# Patient Record
Sex: Female | Born: 1960
Health system: Southern US, Community
[De-identification: ages and names within clinical notes are randomized; demographics above are authoritative.]

## PROBLEM LIST (undated history)

## (undated) DIAGNOSIS — K649 Unspecified hemorrhoids: Secondary | ICD-10-CM

## (undated) HISTORY — DX: Unspecified hemorrhoids: K64.9

## (undated) HISTORY — PX: WISDOM TOOTH EXTRACTION: SHX21

---

## 2005-02-14 ENCOUNTER — Encounter: Admission: RE | Admit: 2005-02-14 | Discharge: 2005-02-14 | Payer: Self-pay

## 2005-07-30 ENCOUNTER — Encounter: Admission: RE | Admit: 2005-07-30 | Discharge: 2005-07-30 | Payer: Self-pay | Admitting: *Deleted

## 2006-01-21 ENCOUNTER — Encounter: Admission: RE | Admit: 2006-01-21 | Discharge: 2006-01-21 | Payer: Self-pay | Admitting: *Deleted

## 2007-01-25 ENCOUNTER — Encounter: Admission: RE | Admit: 2007-01-25 | Discharge: 2007-01-25 | Payer: Self-pay | Admitting: Obstetrics and Gynecology

## 2008-01-27 ENCOUNTER — Encounter: Admission: RE | Admit: 2008-01-27 | Discharge: 2008-01-27 | Payer: Self-pay | Admitting: Obstetrics and Gynecology

## 2009-02-21 ENCOUNTER — Encounter: Admission: RE | Admit: 2009-02-21 | Discharge: 2009-02-21 | Payer: Self-pay | Admitting: Obstetrics and Gynecology

## 2010-03-05 ENCOUNTER — Other Ambulatory Visit: Payer: Self-pay | Admitting: Obstetrics and Gynecology

## 2010-03-05 DIAGNOSIS — Z1231 Encounter for screening mammogram for malignant neoplasm of breast: Secondary | ICD-10-CM

## 2010-03-19 ENCOUNTER — Ambulatory Visit
Admission: RE | Admit: 2010-03-19 | Discharge: 2010-03-19 | Disposition: A | Discharge status: Home / Self Care | Payer: BC Managed Care – PPO | Source: Ambulatory Visit | Attending: Obstetrics and Gynecology | Admitting: Obstetrics and Gynecology

## 2010-03-19 DIAGNOSIS — Z1231 Encounter for screening mammogram for malignant neoplasm of breast: Secondary | ICD-10-CM

## 2010-03-21 ENCOUNTER — Other Ambulatory Visit: Payer: Self-pay | Admitting: Obstetrics and Gynecology

## 2010-03-21 DIAGNOSIS — R928 Other abnormal and inconclusive findings on diagnostic imaging of breast: Secondary | ICD-10-CM

## 2010-03-27 ENCOUNTER — Ambulatory Visit
Admission: RE | Admit: 2010-03-27 | Discharge: 2010-03-27 | Disposition: A | Discharge status: Home / Self Care | Payer: BC Managed Care – PPO | Source: Ambulatory Visit | Attending: Obstetrics and Gynecology | Admitting: Obstetrics and Gynecology

## 2010-03-27 DIAGNOSIS — R928 Other abnormal and inconclusive findings on diagnostic imaging of breast: Secondary | ICD-10-CM

## 2011-12-02 ENCOUNTER — Other Ambulatory Visit: Payer: Self-pay | Admitting: Obstetrics and Gynecology

## 2011-12-02 DIAGNOSIS — Z1231 Encounter for screening mammogram for malignant neoplasm of breast: Secondary | ICD-10-CM

## 2012-01-09 ENCOUNTER — Ambulatory Visit
Admission: RE | Admit: 2012-01-09 | Discharge: 2012-01-09 | Disposition: A | Discharge status: Home / Self Care | Payer: BC Managed Care – PPO | Source: Ambulatory Visit | Attending: Obstetrics and Gynecology | Admitting: Obstetrics and Gynecology

## 2012-01-09 DIAGNOSIS — Z1231 Encounter for screening mammogram for malignant neoplasm of breast: Secondary | ICD-10-CM

## 2014-03-29 ENCOUNTER — Other Ambulatory Visit: Payer: Self-pay | Admitting: Obstetrics and Gynecology

## 2014-03-30 ENCOUNTER — Other Ambulatory Visit: Payer: Self-pay | Admitting: Obstetrics and Gynecology

## 2014-03-30 DIAGNOSIS — M810 Age-related osteoporosis without current pathological fracture: Secondary | ICD-10-CM

## 2014-04-25 ENCOUNTER — Ambulatory Visit
Admission: RE | Admit: 2014-04-25 | Discharge: 2014-04-25 | Disposition: A | Discharge status: Home / Self Care | Payer: BLUE CROSS/BLUE SHIELD | Source: Ambulatory Visit | Attending: Obstetrics and Gynecology | Admitting: Obstetrics and Gynecology

## 2014-04-25 DIAGNOSIS — M810 Age-related osteoporosis without current pathological fracture: Secondary | ICD-10-CM

## 2015-04-17 ENCOUNTER — Other Ambulatory Visit: Payer: Self-pay | Admitting: Internal Medicine

## 2015-04-17 ENCOUNTER — Ambulatory Visit
Admission: RE | Admit: 2015-04-17 | Discharge: 2015-04-17 | Disposition: A | Discharge status: Home / Self Care | Payer: BLUE CROSS/BLUE SHIELD | Source: Ambulatory Visit | Attending: Internal Medicine | Admitting: Internal Medicine

## 2015-04-17 DIAGNOSIS — R0781 Pleurodynia: Secondary | ICD-10-CM

## 2015-06-21 ENCOUNTER — Other Ambulatory Visit: Payer: Self-pay | Admitting: Internal Medicine

## 2015-06-21 DIAGNOSIS — R1012 Left upper quadrant pain: Secondary | ICD-10-CM

## 2015-07-04 ENCOUNTER — Ambulatory Visit
Admission: RE | Admit: 2015-07-04 | Discharge: 2015-07-04 | Disposition: A | Discharge status: Home / Self Care | Payer: BLUE CROSS/BLUE SHIELD | Source: Ambulatory Visit | Attending: Internal Medicine | Admitting: Internal Medicine

## 2015-07-04 DIAGNOSIS — R1012 Left upper quadrant pain: Secondary | ICD-10-CM

## 2015-07-04 IMAGING — CT CT ABDOMEN W/ CM
3 of 5 series · 12 of 36 positions shown, 18 images · IV contrast (READICAT/WATER & [ID] ISOVUE 300)
Comparison: None.

CLINICAL DATA: Left-sided abdominal pain.  Left upper quadrant.

EXAM:
CT ABDOMEN WITH CONTRAST
TECHNIQUE: Multidetector CT imaging of the abdomen was performed using the
standard protocol following bolus administration of intravenous
contrast.
CONTRAST:  100mL [ON] IOPAMIDOL ([ON]) INJECTION 61%

[Series 3: abdomen with · axial · 0.58mm/px · z∈[-254,-64]mm · 6 of 54 slices shown, 11 images]
[im 8/54  soft-tissue]
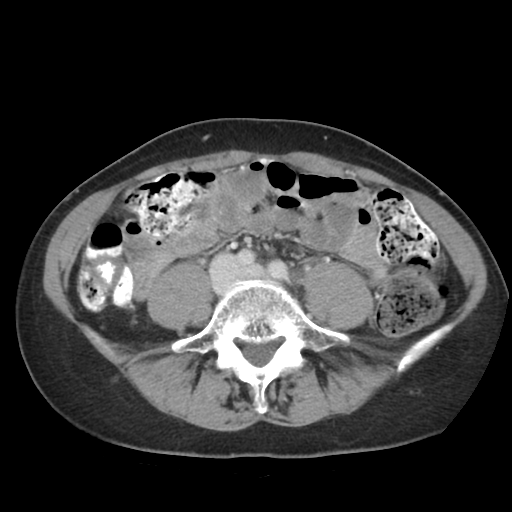
[im 8/54  bone]
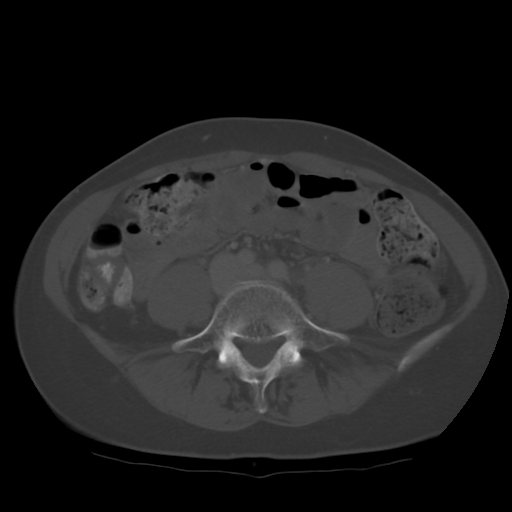
[im 16/54  soft-tissue]
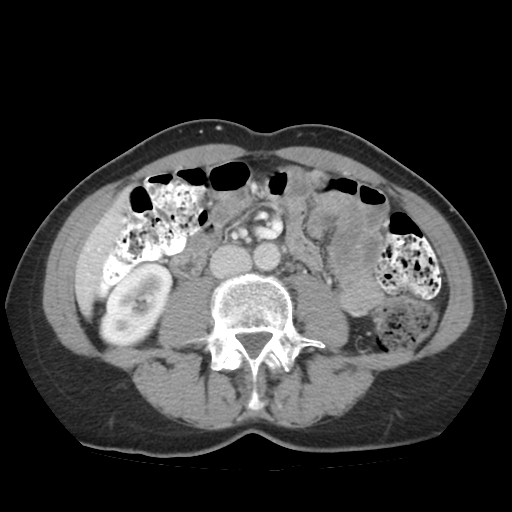
[im 23/54  soft-tissue]
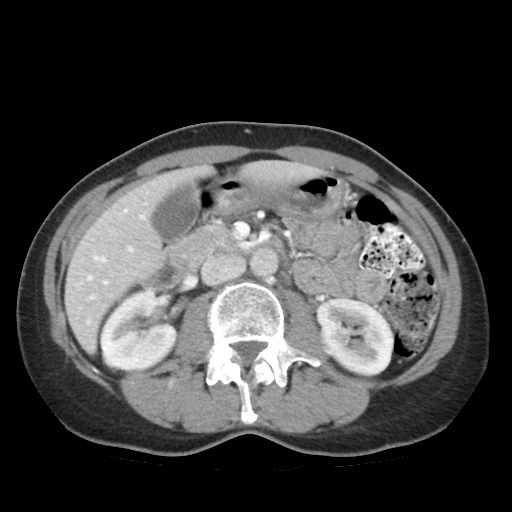
[im 23/54  lung]
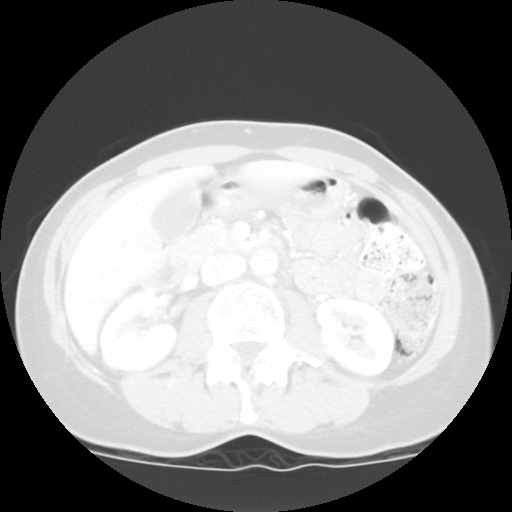
[im 31/54  soft-tissue]
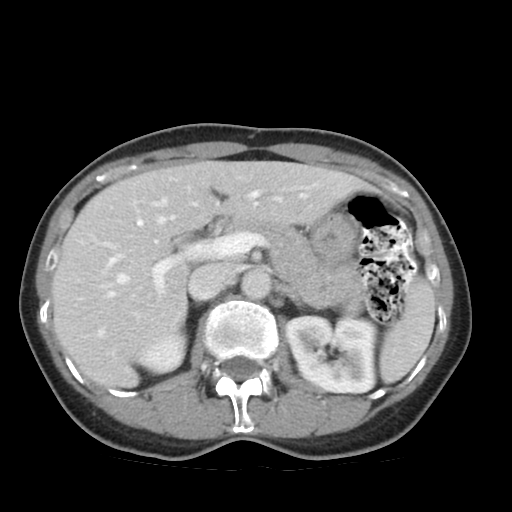
[im 31/54  lung]
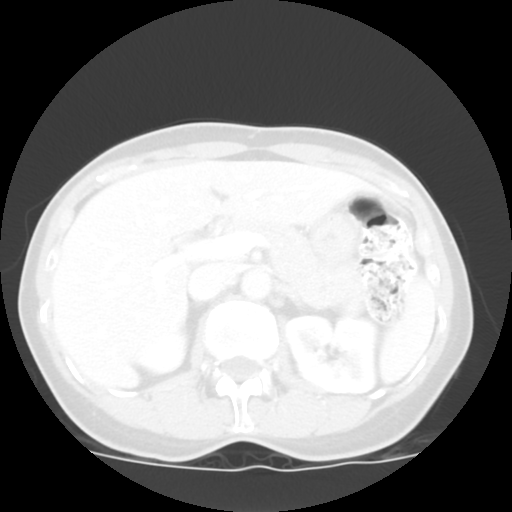
[im 38/54  soft-tissue]
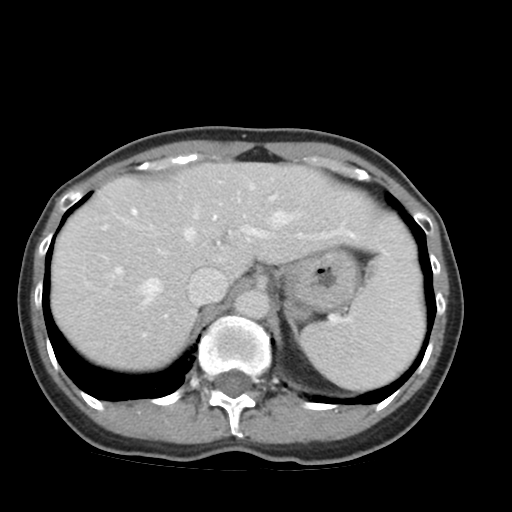
[im 38/54  lung]
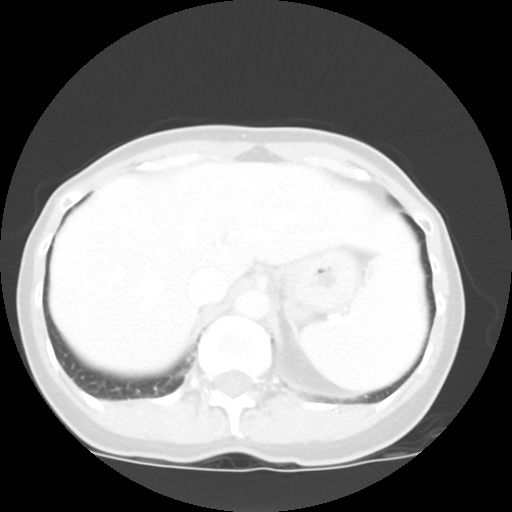
[im 46/54  soft-tissue]
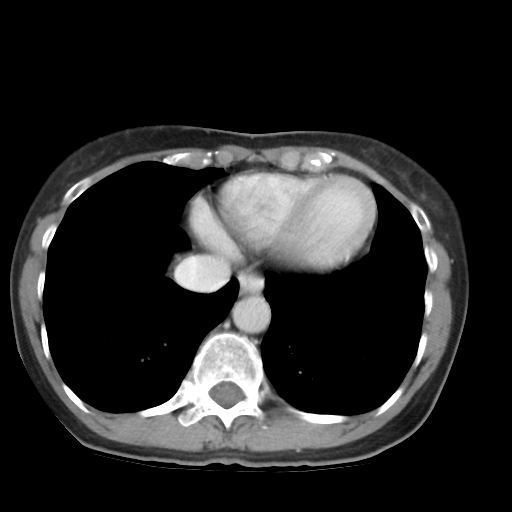
[im 46/54  lung]
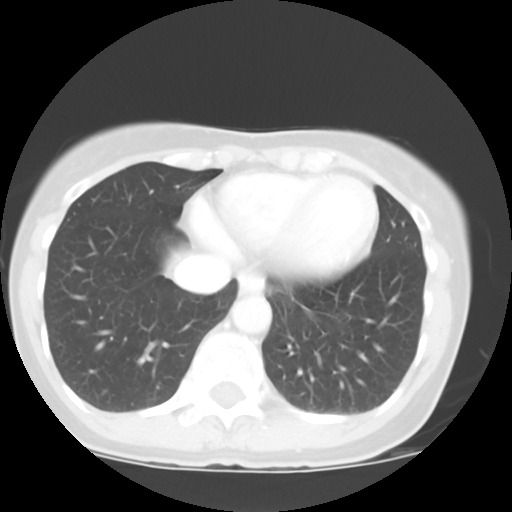

[Series 601: coronal body · coronal · 0.58mm/px · 1 of 87 slices shown, 2 images]
[im 29/87  soft-tissue]
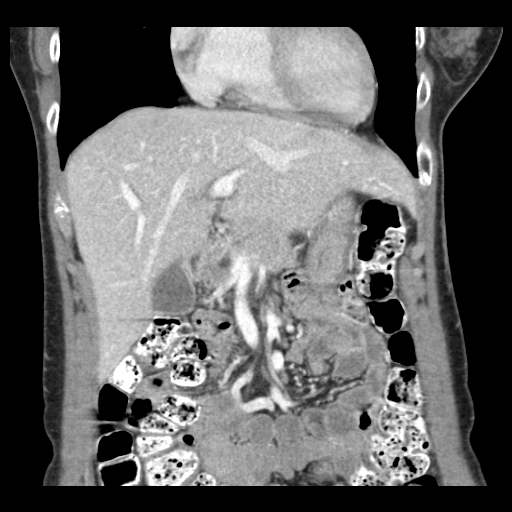
[im 29/87  bone]
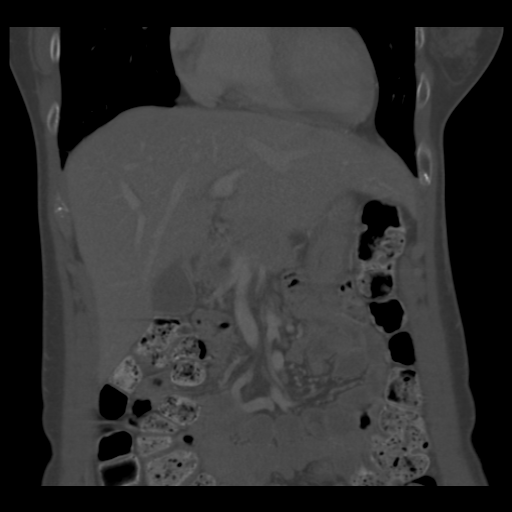

[Series 602: sagittal body · sagittal · 0.58mm/px · 5 of 120 slices shown]
[im 8/120  soft-tissue]
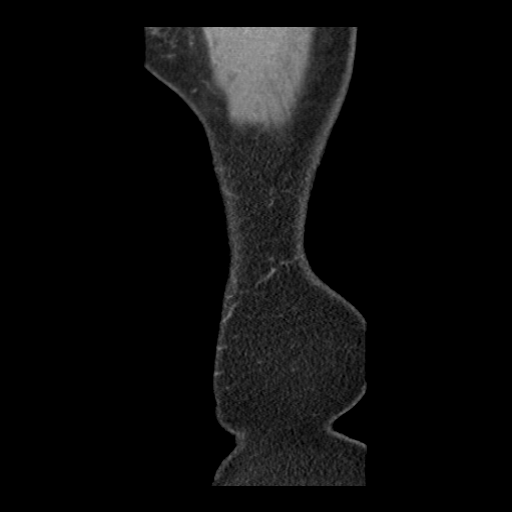
[im 23/120  soft-tissue]
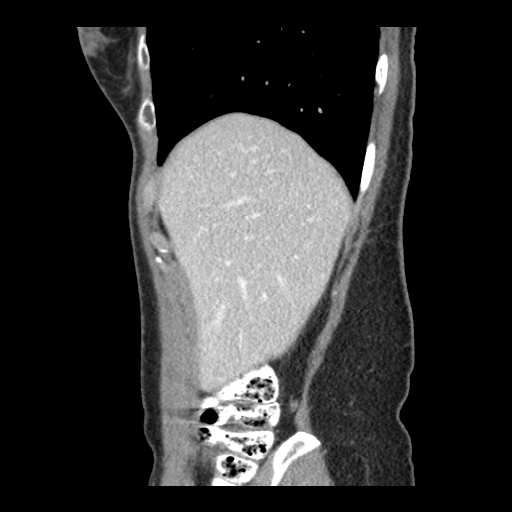
[im 38/120  soft-tissue]
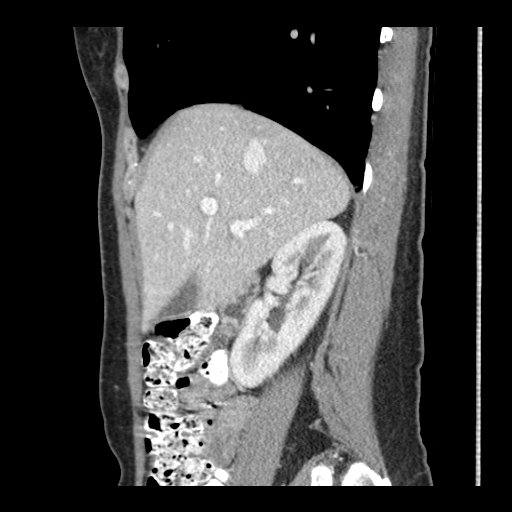
[im 53/120  soft-tissue]
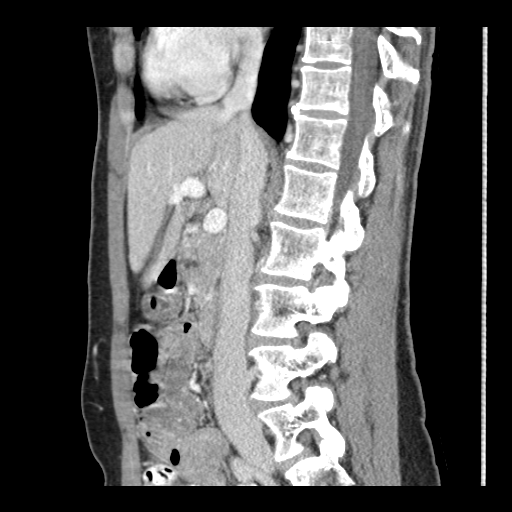
[im 67/120  soft-tissue]
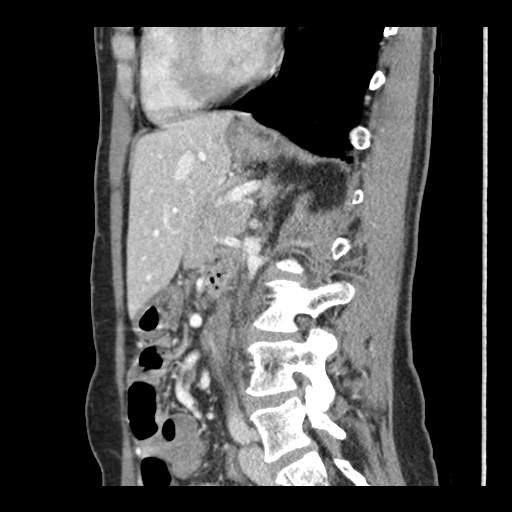

[12 of 36 positions shown; findings below may reference images not displayed]

FINDINGS: Lower chest: Clear lung bases. Normal heart size without pericardial
or pleural effusion.

Hepatobiliary: Focal steatosis adjacent the falciform ligament. Too
small to characterize lateral segment left liver lobe lesion. Normal
gallbladder, without biliary ductal dilatation.

Pancreas: Possible low-density pancreatic head 6 mm lesion on image
28/series 3. Also on coronal image 30. No main duct dilatation or
acute pancreatitis.

Spleen: Normal in size, without focal abnormality.

Adrenals/Urinary Tract: Normal adrenal glands. Normal kidneys,
without hydronephrosis.

Stomach/Bowel: Normal stomach, without wall thickening. Colonic
stool burden suggests constipation. Normal abdominal small bowel.

Vascular/Lymphatic: Normal caliber of the aorta and branch vessels.
No retroperitoneal or retrocrural adenopathy.

Other: No ascites.

Musculoskeletal: No acute osseous abnormality. Prominent disc bulge
at L4-5, including on image 61 sagittal and transverse image
43/series 3. This causes central and right paracentral canal
narrowing.
IMPRESSION: 1. Possible constipation. No other explanation for left upper
quadrant pain.
2. Probable cystic lesion within the pancreatic head. Per consensus
criteria, this warrants followup with pre and post contrast
abdominal MRI (preferred) or pancreatic protocol CT at 1 year. This
recommendation follows ACR consensus guidelines: Managing Incidental
Findings on Abdominal CT: White Paper of the ACR Incidental Findings
Committee. [HOSPITAL] [ON];[DATE]
3. Advanced spondylosis with a prominent disc bulge at L4-5.

## 2015-07-04 MED ORDER — IOPAMIDOL (ISOVUE-300) INJECTION 61%
100.0000 mL | Freq: Once | INTRAVENOUS | Status: AC | PRN
  Administered 2015-07-04: 100 mL via INTRAVENOUS

## 2015-07-16 ENCOUNTER — Encounter: Payer: Self-pay | Admitting: Gastroenterology

## 2015-09-17 ENCOUNTER — Encounter: Payer: Self-pay | Admitting: Gastroenterology

## 2015-09-17 ENCOUNTER — Ambulatory Visit: Payer: BLUE CROSS/BLUE SHIELD | Admitting: *Deleted

## 2015-09-17 VITALS — Ht 65.5 in | Wt 124.2 lb

## 2015-09-17 DIAGNOSIS — Z1211 Encounter for screening for malignant neoplasm of colon: Secondary | ICD-10-CM

## 2015-09-17 NOTE — Progress Notes (Signed)
Patient denies any allergies to egg or soy products. Patient denies complications with anesthesia/sedation.  Patient denies oxygen use at home and denies diet medications. Emmi instructions for colonoscopy but patient denied.  Pamphlet given.  

## 2015-10-01 ENCOUNTER — Ambulatory Visit (AMBULATORY_SURGERY_CENTER): Payer: BLUE CROSS/BLUE SHIELD | Admitting: Gastroenterology

## 2015-10-01 ENCOUNTER — Encounter: Payer: Self-pay | Admitting: Gastroenterology

## 2015-10-01 VITALS — BP 103/67 | HR 42 | Temp 97.8°F | Resp 13 | Ht 65.5 in | Wt 124.0 lb

## 2015-10-01 DIAGNOSIS — Z1211 Encounter for screening for malignant neoplasm of colon: Secondary | ICD-10-CM

## 2015-10-01 MED ORDER — SODIUM CHLORIDE 0.9 % IV SOLN: 500.0000 mL | INTRAVENOUS | Status: AC

## 2015-10-01 NOTE — Patient Instructions (Signed)
YOU HAD AN ENDOSCOPIC PROCEDURE TODAY AT THE [facility]:   Refer to the procedure report that was given to you for any specific questions about what was found during the examination.  If the procedure report does not answer your questions, please call your gastroenterologist to clarify.  If you requested that your care partner not be given the details of your procedure findings, then the procedure report has been included in a sealed envelope for you to review at your convenience later.  YOU SHOULD EXPECT: Some feelings of bloating in the abdomen. Passage of more gas than usual.  Walking can help get rid of the air that was put into your GI tract during the procedure and reduce the bloating. If you had a lower endoscopy (such as a colonoscopy or flexible sigmoidoscopy) you may notice spotting of blood in your stool or on the toilet paper. If you underwent a bowel prep for your procedure, you may not have a normal bowel movement for a few days.  Please Note:  You might notice some irritation and congestion in your nose or some drainage.  This is from the oxygen used during your procedure.  There is no need for concern and it should clear up in a day or so.  SYMPTOMS TO REPORT IMMEDIATELY:   Following lower endoscopy (colonoscopy or flexible sigmoidoscopy):  Excessive amounts of blood in the stool  Significant tenderness or worsening of abdominal pains  Swelling of the abdomen that is new, acute  Fever of 100F or higher  For urgent or emergent issues, a gastroenterologist can be reached at any hour by calling [phone].   DIET:  We do recommend a small meal at first, but then you may proceed to your regular diet.  Drink plenty of fluids but you should avoid alcoholic beverages for 24 hours.  ACTIVITY:  You should plan to take it easy for the rest of today and you should NOT DRIVE or use heavy machinery until tomorrow (because of the sedation medicines used during the test).     FOLLOW UP: Our staff will call the number listed on your records the next business day following your procedure to check on you and address any questions or concerns that you may have regarding the information given to you following your procedure. If we do not reach you, we will leave a message.  However, if you are feeling well and you are not experiencing any problems, there is no need to return our call.  We will assume that you have returned to your regular daily activities without incident.  SIGNATURES/CONFIDENTIALITY: You and/or your care partner have signed paperwork which will be entered into your electronic medical record.  These signatures attest to the fact that that the information above on your After Visit Summary has been reviewed and is understood.  Full responsibility of the confidentiality of this discharge information lies with you and/or your care-partner.  Next colonoscopy-10 years  Please resume your normal medications

## 2015-10-01 NOTE — Op Note (Signed)
[facility] Patient Name: Doris DaneJennifer Bechtel Procedure Date: 10/01/2015 9:18 AM MRN: [id number] Endoscopist: Sherilyn CooterHenry L. Myrtie Neitheranis , MD Age: 55 Referring MD:  Date of Birth: Feb 23, 1960 Gender: Female Account #: [id number] Procedure:                Colonoscopy Indications:              Screening for colorectal malignant neoplasm, This                            is the patient's first colonoscopy Medicines:                Monitored Anesthesia Care Procedure:                Pre-Anesthesia Assessment:                           - Prior to the procedure, a History and Physical                            was performed, and patient medications and                            allergies were reviewed. The patient's tolerance of                            previous anesthesia was also reviewed. The risks                            and benefits of the procedure and the sedation                            options and risks were discussed with the patient.                            All questions were answered, and informed consent                            was obtained. Prior Anticoagulants: The patient has                            taken no previous anticoagulant or antiplatelet                            agents. ASA Grade Assessment: II - A patient with                            mild systemic disease. After reviewing the risks                            and benefits, the patient was deemed in                            satisfactory condition to undergo the procedure.  After obtaining informed consent, the colonoscope                            was passed under direct vision. Throughout the                            procedure, the patient's blood pressure, pulse, and                            oxygen saturations were monitored continuously. The                            Model CF-HQ190L [id number]) scope was introduced                            through the anus and  advanced to the the cecum,                            identified by appendiceal orifice and ileocecal                            valve. The colonoscopy was performed without                            difficulty. The patient tolerated the procedure                            well. The quality of the bowel preparation was                            excellent. The ileocecal valve, appendiceal                            orifice, and rectum were photographed. The quality                            of the bowel preparation was evaluated using the                            BBPS [facility] Bowel Preparation Scale) with scores                            of: Right Colon = 3, Transverse Colon = 3 and Left                            Colon = 3 (entire mucosa seen well with no residual                            staining, small fragments of stool or opaque                            liquid). The total BBPS score equals 9. The bowel  preparation used was SUPREP. Scope In: 9:25:25 AM Scope Out: 9:39:49 AM Scope Withdrawal Time: 0 hours 9 minutes 55 seconds  Total Procedure Duration: 0 hours 14 minutes 24 seconds  Findings:                 The perianal and digital rectal examinations were                            normal.                           The entire examined colon appeared normal on direct                            and retroflexion views. Complications:            No immediate complications. Estimated Blood Loss:     Estimated blood loss: none. Impression:               - The entire examined colon is normal on direct and                            retroflexion views.                           - No specimens collected. Recommendation:           - Patient has a contact number available for                            emergencies. The signs and symptoms of potential                            delayed complications were discussed with the                             patient. Return to normal activities tomorrow.                            Written discharge instructions were provided to the                            patient.                           - Resume previous diet.                           - Continue present medications.                           - Repeat colonoscopy in 10 years for screening                            purposes. Henry L. Myrtie Neither, MD 10/01/2015 9:42:53 AM This report has been signed electronically.

## 2015-10-01 NOTE — Progress Notes (Signed)
Report to PACU, RN, vss, BBS= Clear.  

## 2015-10-02 ENCOUNTER — Telehealth: Payer: Self-pay

## 2015-10-02 NOTE — Telephone Encounter (Signed)
Left a message at [phone] for the pt to call us back if any questions or concerns. maw

## 2016-07-08 ENCOUNTER — Other Ambulatory Visit: Payer: Self-pay | Admitting: Internal Medicine

## 2016-07-08 DIAGNOSIS — K862 Cyst of pancreas: Secondary | ICD-10-CM

## 2016-07-10 ENCOUNTER — Other Ambulatory Visit: Payer: Self-pay | Admitting: Internal Medicine

## 2016-07-19 ENCOUNTER — Ambulatory Visit
Admission: RE | Admit: 2016-07-19 | Discharge: 2016-07-19 | Disposition: A | Discharge status: Home / Self Care | Payer: BLUE CROSS/BLUE SHIELD | Source: Ambulatory Visit | Attending: Internal Medicine | Admitting: Internal Medicine

## 2016-07-19 DIAGNOSIS — K862 Cyst of pancreas: Secondary | ICD-10-CM

## 2016-07-19 MED ORDER — GADOBENATE DIMEGLUMINE 529 MG/ML IV SOLN
15.0000 mL | Freq: Once | INTRAVENOUS | Status: AC | PRN
  Administered 2016-07-19: 11 mL via INTRAVENOUS

## 2017-07-20 ENCOUNTER — Other Ambulatory Visit: Payer: Self-pay | Admitting: Internal Medicine

## 2017-07-20 DIAGNOSIS — K862 Cyst of pancreas: Secondary | ICD-10-CM

## 2017-07-26 ENCOUNTER — Ambulatory Visit
Admission: RE | Admit: 2017-07-26 | Discharge: 2017-07-26 | Disposition: A | Discharge status: Home / Self Care | Payer: BLUE CROSS/BLUE SHIELD | Source: Ambulatory Visit | Attending: Internal Medicine | Admitting: Internal Medicine

## 2017-07-26 DIAGNOSIS — K862 Cyst of pancreas: Secondary | ICD-10-CM

## 2017-07-26 MED ORDER — GADOBENATE DIMEGLUMINE 529 MG/ML IV SOLN
11.0000 mL | Freq: Once | INTRAVENOUS | Status: AC | PRN
  Administered 2017-07-26: 11 mL via INTRAVENOUS

## 2017-09-10 ENCOUNTER — Ambulatory Visit
Admission: RE | Admit: 2017-09-10 | Discharge: 2017-09-10 | Disposition: A | Discharge status: Home / Self Care | Payer: BLUE CROSS/BLUE SHIELD | Source: Ambulatory Visit | Attending: Obstetrics and Gynecology | Admitting: Obstetrics and Gynecology

## 2017-09-10 ENCOUNTER — Other Ambulatory Visit: Payer: Self-pay | Admitting: Obstetrics and Gynecology

## 2017-09-10 DIAGNOSIS — M810 Age-related osteoporosis without current pathological fracture: Secondary | ICD-10-CM

## 2017-09-11 ENCOUNTER — Ambulatory Visit: Payer: BLUE CROSS/BLUE SHIELD | Admitting: Gastroenterology

## 2017-10-15 ENCOUNTER — Other Ambulatory Visit: Payer: Self-pay | Admitting: Sports Medicine

## 2017-10-15 DIAGNOSIS — G8929 Other chronic pain: Secondary | ICD-10-CM

## 2017-10-15 DIAGNOSIS — M25572 Pain in left ankle and joints of left foot: Principal | ICD-10-CM

## 2017-10-26 ENCOUNTER — Ambulatory Visit
Admission: RE | Admit: 2017-10-26 | Discharge: 2017-10-26 | Disposition: A | Discharge status: Home / Self Care | Payer: BLUE CROSS/BLUE SHIELD | Source: Ambulatory Visit | Attending: Sports Medicine | Admitting: Sports Medicine

## 2017-10-26 DIAGNOSIS — G8929 Other chronic pain: Secondary | ICD-10-CM

## 2017-10-26 DIAGNOSIS — M25572 Pain in left ankle and joints of left foot: Principal | ICD-10-CM

## 2018-11-23 ENCOUNTER — Other Ambulatory Visit: Payer: Self-pay | Admitting: Internal Medicine

## 2018-11-23 DIAGNOSIS — E2839 Other primary ovarian failure: Secondary | ICD-10-CM

## 2019-10-02 ENCOUNTER — Ambulatory Visit: Payer: Self-pay | Attending: Internal Medicine

## 2019-10-02 DIAGNOSIS — Z23 Encounter for immunization: Secondary | ICD-10-CM

## 2019-10-02 NOTE — Progress Notes (Signed)
   Covid-19 Vaccination Clinic  Name:  Charisa Twitty    MRN: [id number] DOB: 07-Mar-1960  10/02/2019  Ms. Colberg was observed post Covid-19 immunization for 15 minutes without incident. She was provided with Vaccine Information Sheet and instruction to access the V-Safe system.   Ms. Carmon was instructed to call 911 with any severe reactions post vaccine: Marland Kitchen Difficulty breathing  . Swelling of face and throat  . A fast heartbeat  . A bad rash all over body  . Dizziness and weakness

## 2019-10-31 ENCOUNTER — Other Ambulatory Visit: Payer: Self-pay | Admitting: Internal Medicine

## 2019-10-31 DIAGNOSIS — Z8739 Personal history of other diseases of the musculoskeletal system and connective tissue: Secondary | ICD-10-CM

## 2019-11-01 ENCOUNTER — Other Ambulatory Visit: Payer: Self-pay | Admitting: Internal Medicine

## 2019-11-01 DIAGNOSIS — Z8739 Personal history of other diseases of the musculoskeletal system and connective tissue: Secondary | ICD-10-CM

## 2019-11-14 ENCOUNTER — Ambulatory Visit
Admission: RE | Admit: 2019-11-14 | Discharge: 2019-11-14 | Disposition: A | Discharge status: Home / Self Care | Payer: BC Managed Care – PPO | Source: Ambulatory Visit | Attending: Internal Medicine | Admitting: Internal Medicine

## 2019-11-14 ENCOUNTER — Other Ambulatory Visit: Payer: Self-pay

## 2019-11-14 DIAGNOSIS — Z8739 Personal history of other diseases of the musculoskeletal system and connective tissue: Secondary | ICD-10-CM

## 2019-11-23 ENCOUNTER — Other Ambulatory Visit: Payer: Self-pay | Admitting: Internal Medicine

## 2019-11-23 DIAGNOSIS — K862 Cyst of pancreas: Secondary | ICD-10-CM

## 2019-12-20 ENCOUNTER — Ambulatory Visit
Admission: RE | Admit: 2019-12-20 | Discharge: 2019-12-20 | Disposition: A | Discharge status: Home / Self Care | Payer: BC Managed Care – PPO | Source: Ambulatory Visit | Attending: Internal Medicine | Admitting: Internal Medicine

## 2019-12-20 DIAGNOSIS — K862 Cyst of pancreas: Secondary | ICD-10-CM

## 2019-12-20 MED ORDER — GADOBENATE DIMEGLUMINE 529 MG/ML IV SOLN
11.0000 mL | Freq: Once | INTRAVENOUS | Status: AC | PRN
  Administered 2019-12-20: 11 mL via INTRAVENOUS

## 2020-01-27 ENCOUNTER — Other Ambulatory Visit: Payer: Self-pay

## 2020-02-10 ENCOUNTER — Other Ambulatory Visit: Payer: Self-pay

## 2020-10-15 ENCOUNTER — Other Ambulatory Visit: Payer: Self-pay | Admitting: Internal Medicine

## 2020-10-15 DIAGNOSIS — K30 Functional dyspepsia: Secondary | ICD-10-CM

## 2020-10-16 ENCOUNTER — Ambulatory Visit
Admission: RE | Admit: 2020-10-16 | Discharge: 2020-10-16 | Disposition: A | Discharge status: Home / Self Care | Payer: BC Managed Care – PPO | Source: Ambulatory Visit | Attending: Internal Medicine | Admitting: Internal Medicine

## 2020-10-16 ENCOUNTER — Other Ambulatory Visit: Payer: Self-pay | Admitting: Internal Medicine

## 2020-10-16 DIAGNOSIS — K30 Functional dyspepsia: Secondary | ICD-10-CM

## 2020-11-27 ENCOUNTER — Ambulatory Visit
Admission: RE | Admit: 2020-11-27 | Discharge: 2020-11-27 | Disposition: A | Discharge status: Home / Self Care | Payer: BC Managed Care – PPO | Source: Ambulatory Visit | Attending: Internal Medicine | Admitting: Internal Medicine

## 2020-11-27 ENCOUNTER — Other Ambulatory Visit: Payer: Self-pay | Admitting: Internal Medicine

## 2020-11-27 DIAGNOSIS — M542 Cervicalgia: Secondary | ICD-10-CM

## 2021-06-14 ENCOUNTER — Ambulatory Visit: Payer: BC Managed Care – PPO | Admitting: Family Medicine

## 2021-06-14 ENCOUNTER — Ambulatory Visit: Payer: Self-pay

## 2021-06-14 VITALS — BP 90/60 | Ht 65.0 in | Wt 120.0 lb

## 2021-06-14 DIAGNOSIS — M5010 Cervical disc disorder with radiculopathy, unspecified cervical region: Secondary | ICD-10-CM | POA: Diagnosis not present

## 2021-06-14 DIAGNOSIS — M25521 Pain in right elbow: Secondary | ICD-10-CM | POA: Diagnosis not present

## 2021-06-14 MED ORDER — NITROGLYCERIN 0.2 MG/HR TD PT24: MEDICATED_PATCH | TRANSDERMAL | 1 refills | Status: AC

## 2021-06-14 NOTE — Assessment & Plan Note (Signed)
In regards to her significant cervical spine pain and findings on MRI, I think she would be best served from receiving a second opinion from [facility] since she is questioning the need for an epidural.  It does appear that given the narrowing of the spinal canal, neurosurgery would be the best to discuss this with her.  She is also having some concerning findings on history based on the shocks that she is having while she is tilting her head backwards although this could not be reproduced today.

## 2021-06-14 NOTE — Progress Notes (Signed)
[facility]: Attending Note: I have reviewed the chart, discussed wit the Sports Medicine Fellow. I agree with assessment and treatment plan as detailed in the Fellow's note.  

## 2021-06-14 NOTE — Patient Instructions (Signed)
Nitroglycerin Protocol  Apply 1/4 nitroglycerin patch to affected area daily. Change position of patch within the affected area every 24 hours. You may experience a headache during the first 1-2 weeks of using the patch, these should subside. If you experience headaches after beginning nitroglycerin patch treatment, you may take your preferred over the counter pain reliever. Another side effect of the nitroglycerin patch is skin irritation or rash related to patch adhesive. Please notify our office if you develop more severe headaches or rash, and stop the patch. Tendon healing with nitroglycerin patch may require 12 to 24 weeks depending on the extent of injury. Men should not use if taking Viagra, Cialis, or Levitra.  Do not use if you have migraines or rosacea.    [facility] will be giving you a call for your appt [phone]) [address], [address]

## 2021-06-14 NOTE — Assessment & Plan Note (Signed)
In regards to her right elbow pain, this is most consistent with lateral epicondylitis.  We will start her on nitroglycerin patches and give her home exercises for this.  We will have her follow-up in 4 weeks to see how she is doing at that time.  If she is not making significant improvement, could consider shockwave therapy.  She did specifically asked about a corticosteroid injection, but advised that this would not be an improving long-term healing and would be a last resort for acute pain.

## 2021-06-14 NOTE — Progress Notes (Signed)
Doris Sanders is a 61 y.o. female who presents to [facility] today for the following:  Neck pain Has been seen by [facility] for this She has had prior MRI which she brings with her It does appear that there is significant degenerative disc disease at C4-C5 with spurring that is narrowing at the spinal foramen X-rays performed on 11/27/2020 show facet arthropathy, worse at C4-C5, degenerative disc disease worse at C4-C5 with some anterior spurring at this region as well She has had facet injection performed about 2 months ago in her neck that did not improve it She is scheduled for an epidural coming up in the next few weeks with [facility], but is not sure if she wants to do this She does report that she wakes up with numbness and tingling in the right arm This pain has been occurring since last year when she was driving back and forth to [place] every week and started as some tightness in the right upper trapezius but never improved with any treatments that she was doing for trigger points She does report a history of electrical shocks on the right arm when she is leaning her head backwards to do her hair so she has changed how she has been doing her hair  She has a maternal history of osteoporosis and reports that she is currently on treatment for this from endocrinology   Right Elbow Pain Has been going on for a few months She has tried some trigger point treatments in this area without much improvement Pain hurts worse with gripping She is not dropping anything She is right-hand dominant She does work as a Restaurant manager, fast food 2 days a week, but this does not seem to bother her while she is working Pain is over the lateral epicondyle and into the forearm   PMH reviewed.  ROS as above. Medications reviewed.  Exam:  BP 90/60   Ht 5\' 5"  (1.651 m)   Wt 120 lb (54.4 kg)   BMI 19.97 kg/m  Gen: Well NAD MSK:  Neck/Back: - Inspection: no gross deformity or asymmetry, swelling or  ecchymosis - Palpation: No TTP spinous process, of the most superior aspect of the trapezius about midway between the shoulder and the neck she has a significant trigger point with tenderness to palpation there is also increased hypertonicity throughout the upper trapezius on the right compared to the left - ROM: full active ROM of the cervical spine with neck extension, rotation, flexion -pain with right-sided rotation and sidebending - Strength: 5/5 wrist flexion, extension, biceps flexion, triceps extension. OK sign, interosseus strength intact  - Neuro: sensation intact in the C5-C8 nerve root distribution b/l, 2+ C5-C7 reflexes - Special testing: Negative spurling's  Right Elbow: - Inspection: no obvious deformity b/l. No swelling, erythema or bruising b/l - Palpation: TTP over lateral epicondyle - ROM: full active ROM in flexion and extension b/l. No crepitus - Strength: 5/5 strength in wrist flexion and extension b/l. 5/5 strength in biceps, triceps b/l. - Neuro: NV intact distally b/l - Special testing: no laxity with varus/valgus stress.  There is significant pain with resisted wrist extension as well as third finger extension.  Limited ultrasound of the right elbow shows the lateral epicondyle with a small spur off of the most lateral aspect of the bone.  There is a heterogeneous appearance of the insertion of the tendon at the lateral epicondyle with significant overlying Doppler flow in this area.  There are also few small calcifications near the radial head.  The ulnar nerve at the cubital tunnel was visualized without any significant abnormalities and there is no instability or snapping of the ulnar nerve noted dynamically. Impression: Lateral epicondylitis  No results found.   Assessment and Plan: 1) Right elbow pain In regards to her right elbow pain, this is most consistent with lateral epicondylitis.  We will start her on nitroglycerin patches and give her home exercises for  this.  We will have her follow-up in 4 weeks to see how she is doing at that time.  If she is not making significant improvement, could consider shockwave therapy.  She did specifically asked about a corticosteroid injection, but advised that this would not be an improving long-term healing and would be a last resort for acute pain.  Cervical disc disorder with radiculopathy of cervical region In regards to her significant cervical spine pain and findings on MRI, I think she would be best served from receiving a second opinion from [facility] since she is questioning the need for an epidural.  It does appear that given the narrowing of the spinal canal, neurosurgery would be the best to discuss this with her.  She is also having some concerning findings on history based on the shocks that she is having while she is tilting her head backwards although this could not be reproduced today.   Arizona Constable, D.O.  PGY-4 [facility]  06/14/2021 11:52 AM

## 2021-07-10 ENCOUNTER — Ambulatory Visit: Payer: BC Managed Care – PPO | Admitting: Family Medicine

## 2021-07-17 ENCOUNTER — Ambulatory Visit: Payer: BC Managed Care – PPO | Admitting: Family Medicine

## 2021-12-05 ENCOUNTER — Other Ambulatory Visit: Payer: Self-pay | Admitting: Internal Medicine

## 2021-12-05 DIAGNOSIS — M810 Age-related osteoporosis without current pathological fracture: Secondary | ICD-10-CM

## 2021-12-31 ENCOUNTER — Ambulatory Visit
Admission: RE | Admit: 2021-12-31 | Discharge: 2021-12-31 | Disposition: A | Discharge status: Home / Self Care | Payer: BC Managed Care – PPO | Source: Ambulatory Visit | Attending: Internal Medicine | Admitting: Internal Medicine

## 2021-12-31 DIAGNOSIS — M810 Age-related osteoporosis without current pathological fracture: Secondary | ICD-10-CM

## 2022-05-29 ENCOUNTER — Other Ambulatory Visit: Payer: BC Managed Care – PPO

## 2022-09-18 ENCOUNTER — Ambulatory Visit: Payer: Self-pay | Admitting: Podiatry
